# Patient Record
Sex: Female | Born: 1970 | Race: Black or African American | Hispanic: No | Marital: Married
Health system: Midwestern US, Community
[De-identification: ages and names within clinical notes are randomized; demographics above are authoritative.]

---

## 2012-07-15 IMAGING — MG MM DIGITAL SCREENING
7 series · 7 of 7 positions shown · non-contrast
Comparison: none

DG SCREEN MAMMOGRAM BILATERAL
Bilateral CC and MLO view(s) were taken.

DIGITAL SCREENING MAMMOGRAM WITH CAD:
There are scattered fibroglandular densities.  A possible mass is noted in the left breast.  Spot 
compression views and possibly sonography are recommended for further evaluation.  In the right 
breast, no masses or malignant type calcifications are identified.
Images were processed with CAD.

[L CC (1 of 2)]
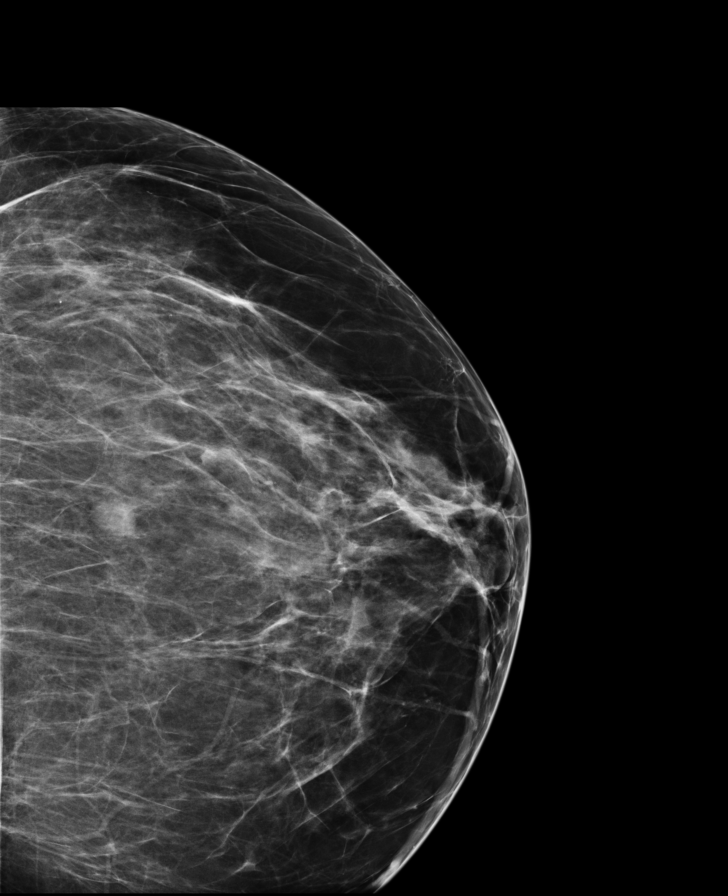

[L MLO]
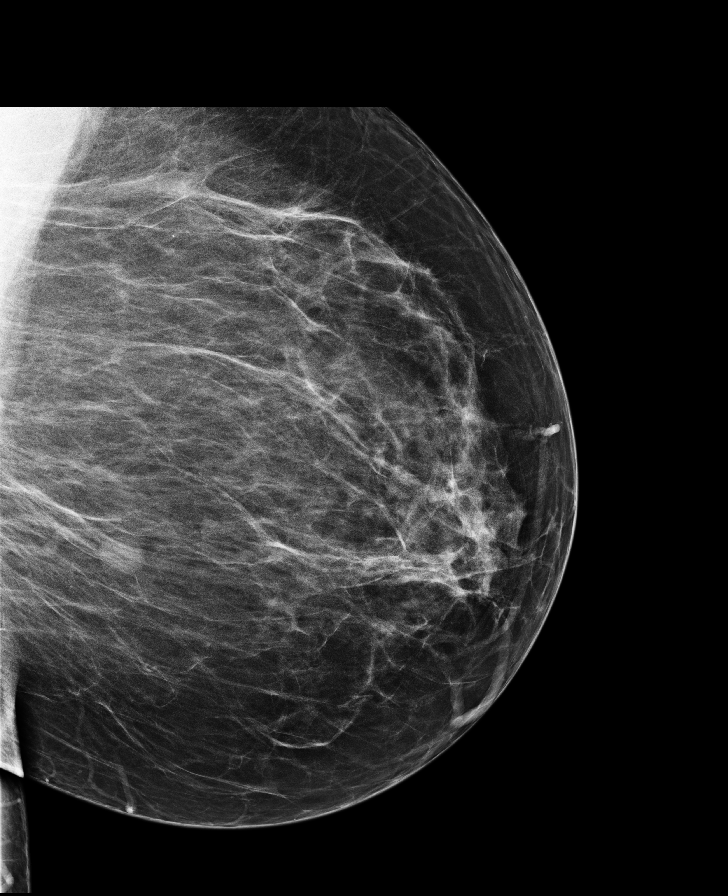

[R CC (1 of 2)]
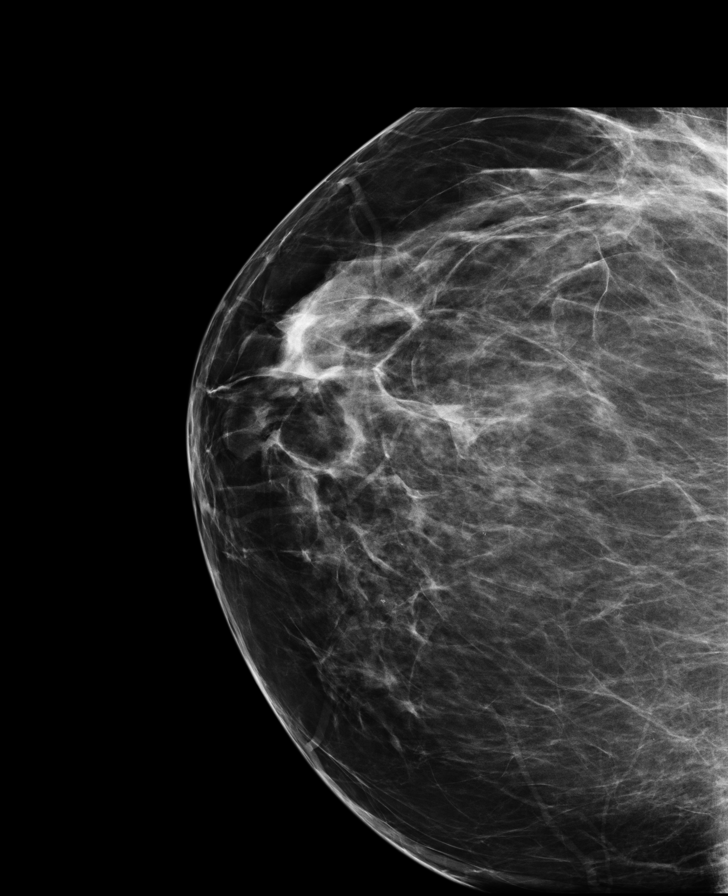

[R MLO (1 of 2)]
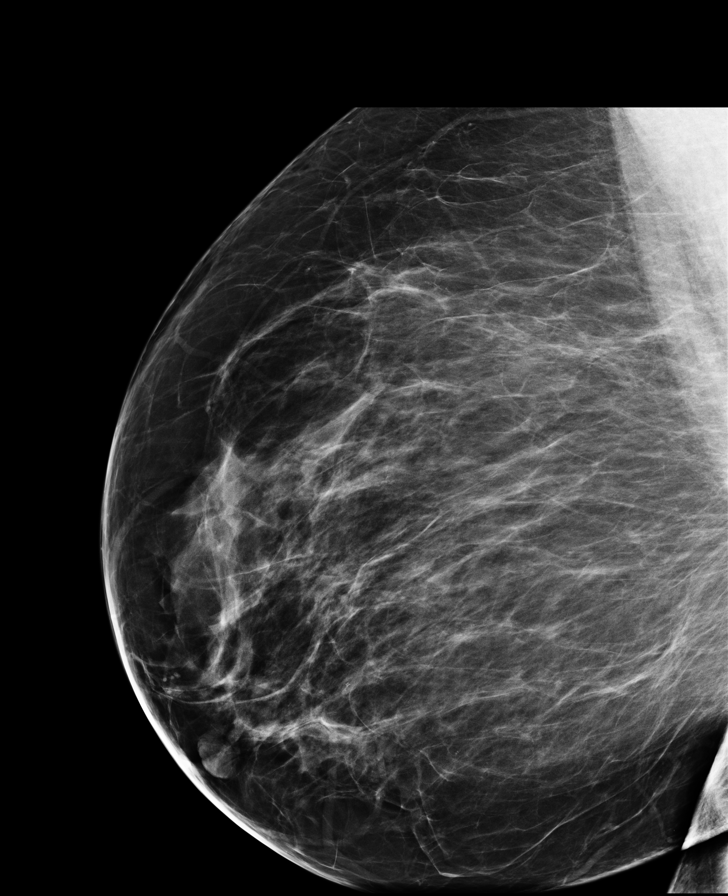

[L CC (2 of 2)]
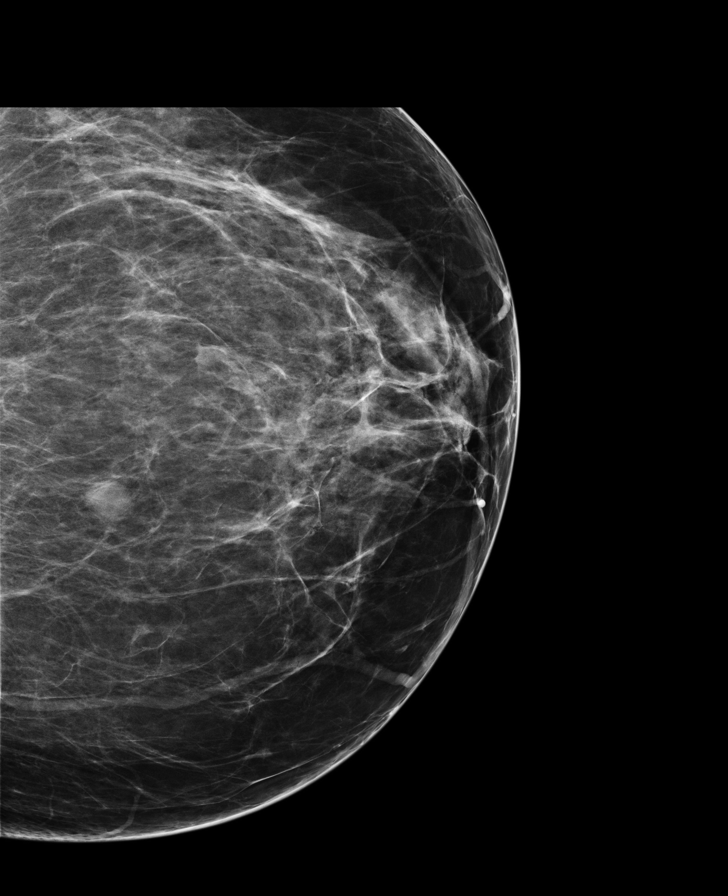

[R CC (2 of 2)]
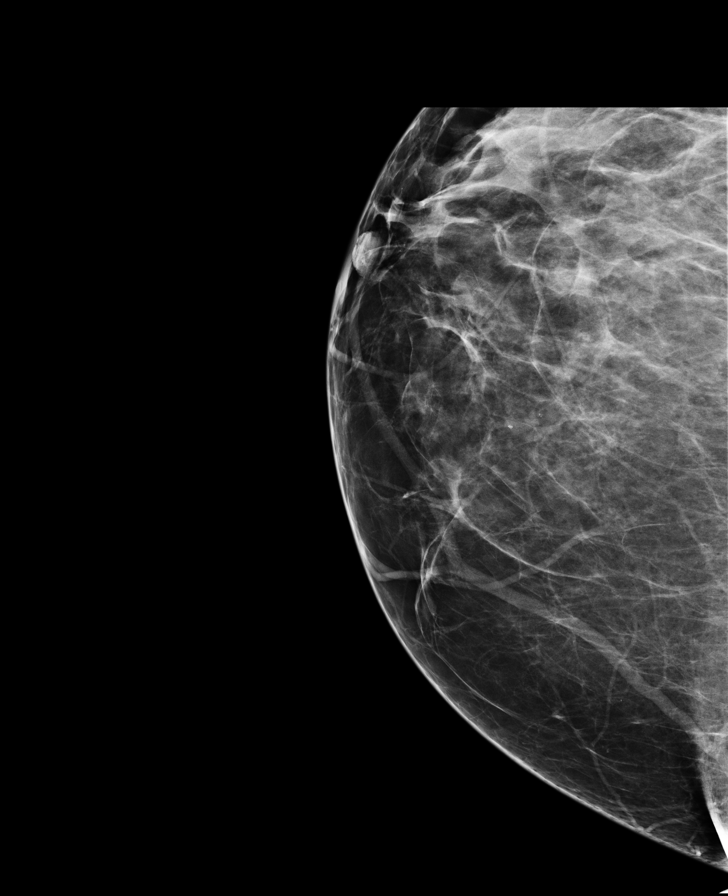

[R MLO (2 of 2)]
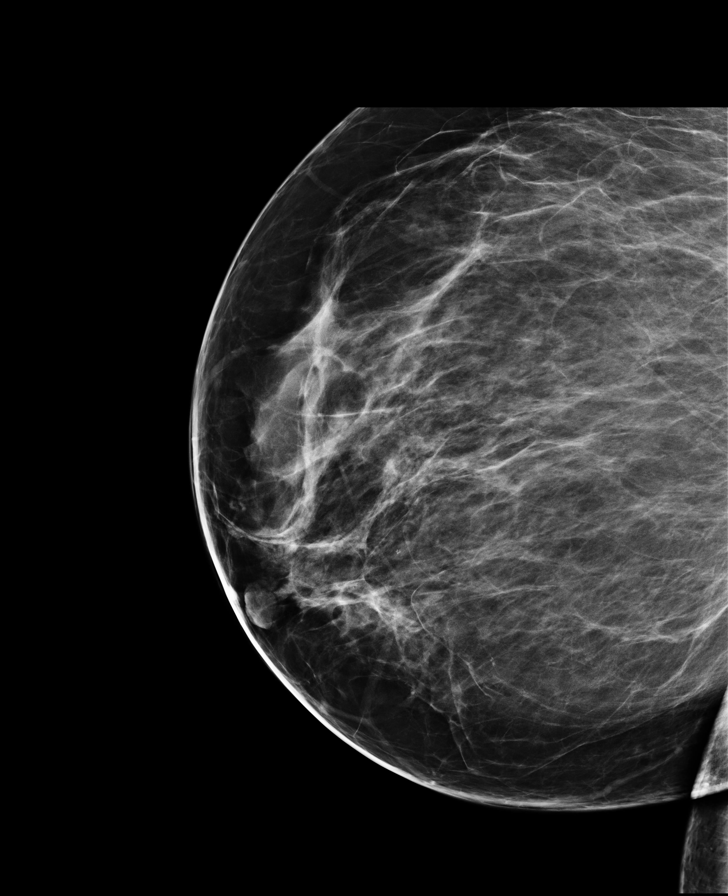

[7 of 7 positions shown; findings below may reference images not displayed]

IMPRESSION: Possible mass, left breast.  Additional evaluation is indicated.  The patient will be contacted for
additional studies and a supplementary report will follow.  No specific mammographic evidence of 
malignancy, right breast.

ASSESSMENT: Need additional imaging evaluation and/or prior mammograms for comparison - BI-RADS 0

Further imaging of the left breast.
,

## 2016-09-06 ENCOUNTER — Emergency Department: Admit: 2016-09-06 | Payer: BLUE CROSS/BLUE SHIELD

## 2016-09-06 ENCOUNTER — Inpatient Hospital Stay
Admit: 2016-09-06 | Discharge: 2016-09-06 | Disposition: A | Discharge status: Home / Self Care | Payer: BLUE CROSS/BLUE SHIELD | Attending: Emergency Medicine

## 2016-09-06 DIAGNOSIS — K5732 Diverticulitis of large intestine without perforation or abscess without bleeding: Secondary | ICD-10-CM

## 2016-09-06 LAB — CBC WITH AUTO DIFFERENTIAL
Absolute Eos #: 0.1 10*3/uL (ref 0.0–0.4)
Absolute Lymph #: 1.2 10*3/uL (ref 1.0–4.8)
Absolute Mono #: 0.4 10*3/uL (ref 0.2–0.8)
Basophils Absolute: 0 10*3/uL (ref 0.0–0.2)
Basophils: 0 %
Eosinophils %: 1 %
Hematocrit: 38.1 % (ref 36–46)
Hemoglobin: 12.6 g/dL (ref 12.0–16.0)
Lymphocytes: 11 %
MCH: 27.1 pg (ref 26–34)
MCHC: 33 g/dL (ref 31–37)
MCV: 82.2 fL (ref 80–100)
MPV: 8 fL (ref 6.0–12.0)
Monocytes: 3 %
Platelets: 309 10*3/uL (ref 130–400)
RBC: 4.64 m/uL (ref 4.0–5.2)
RDW: 16.4 % — ABNORMAL HIGH (ref 11.5–14.5)
Seg Neutrophils: 85 %
Segs Absolute: 9.7 10*3/uL — ABNORMAL HIGH (ref 1.8–7.7)
WBC: 11.4 10*3/uL — ABNORMAL HIGH (ref 3.5–11.0)

## 2016-09-06 LAB — MICROSCOPIC URINALYSIS
Epithelial Cells UA: 2 /HPF (ref 0–5)
RBC, UA: 5 /HPF (ref 0–2)

## 2016-09-06 LAB — BASIC METABOLIC PANEL
Anion Gap: 14 mmol/L (ref 9–17)
BUN: 17 mg/dL (ref 6–20)
Bun/Cre Ratio: 13 (ref 9–20)
CO2: 21 mmol/L (ref 20–31)
Calcium: 9.3 mg/dL (ref 8.6–10.4)
Chloride: 102 mmol/L (ref 98–107)
Creatinine: 1.32 mg/dL — ABNORMAL HIGH (ref 0.50–0.90)
GFR African American: 53 mL/min — ABNORMAL LOW (ref >60)
GFR Non-African American: 44 mL/min — ABNORMAL LOW (ref >60)
Glucose: 135 mg/dL — ABNORMAL HIGH (ref 70–99)
Potassium: 4.3 mmol/L (ref 3.7–5.3)
Sodium: 137 mmol/L (ref 135–144)

## 2016-09-06 LAB — UA W/REFLEX CULTURE
Bilirubin Urine: NEGATIVE
Glucose, Ur: NEGATIVE
Ketones, Urine: NEGATIVE
Leukocyte Esterase, Urine: NEGATIVE
Nitrite, Urine: NEGATIVE
Specific Gravity, UA: 1.028 (ref 1.005–1.030)
Urobilinogen, Urine: NORMAL
pH, UA: 5.5 (ref 5.0–8.0)

## 2016-09-06 LAB — POCT URINE PREGNANCY: Preg Test, Ur: NEGATIVE

## 2016-09-06 MED ORDER — HYDROCODONE-ACETAMINOPHEN 5-325 MG PO TABS
5-325 MG | ORAL_TABLET | ORAL | 0 refills | Status: AC | PRN
Start: 2016-09-06 — End: ?

## 2016-09-06 MED ORDER — POLYETHYLENE GLYCOL 3350 17 GM/SCOOP PO POWD
17 GM/SCOOP | Freq: Every day | ORAL | 0 refills | Status: AC
Start: 2016-09-06 — End: 2016-09-13

## 2016-09-06 MED ORDER — SODIUM CHLORIDE 0.9 % IV BOLUS
0.9 % | Freq: Once | INTRAVENOUS | Status: AC
Start: 2016-09-06 — End: 2016-09-06
  Administered 2016-09-06: 13:00:00 1000 mL via INTRAVENOUS

## 2016-09-06 MED ORDER — HYDROMORPHONE HCL 2 MG/ML IJ SOLN
2 MG/ML | INTRAMUSCULAR | Status: DC | PRN
Start: 2016-09-06 — End: 2016-09-06
  Administered 2016-09-06: 13:00:00 1 mg via INTRAVENOUS

## 2016-09-06 MED ORDER — METRONIDAZOLE 500 MG PO TABS
500 MG | ORAL_TABLET | Freq: Three times a day (TID) | ORAL | 0 refills | Status: AC
Start: 2016-09-06 — End: ?

## 2016-09-06 MED ORDER — AMOXICILLIN-POT CLAVULANATE 875-125 MG PO TABS
875-125 MG | ORAL_TABLET | Freq: Three times a day (TID) | ORAL | 0 refills | Status: AC
Start: 2016-09-06 — End: ?

## 2016-09-06 MED ORDER — PROMETHAZINE HCL 25 MG/ML IJ SOLN
25 MG/ML | Freq: Once | INTRAMUSCULAR | Status: AC
Start: 2016-09-06 — End: 2016-09-06
  Administered 2016-09-06: 13:00:00 12.5 mg via INTRAVENOUS

## 2016-09-06 MED FILL — PROMETHAZINE HCL 25 MG/ML IJ SOLN: 25 mg/mL | INTRAMUSCULAR | Qty: 1

## 2016-09-06 MED FILL — HYDROMORPHONE HCL 2 MG/ML IJ SOLN: 2 MG/ML | INTRAMUSCULAR | Qty: 1

## 2016-09-06 NOTE — Discharge Instructions (Signed)
Do not drink alcohol while on Flagyl.  Eat a half cup of yogurt daily.  Liquid diet for 2 days.

## 2016-09-06 NOTE — ED Provider Notes (Signed)
[place] [facility] ED  eMERGENCY dEPARTMENT eNCOUnter      Pt Name: Catherine Carr  MRN: [id number]  Birthdate September 12, 1971  Date of evaluation: 09/06/2016  Provider: Wess Botts, MD    CHIEF COMPLAINT       Chief Complaint   Patient presents with   ??? Abdominal Pain         HISTORY OF PRESENT ILLNESS   (Location/Symptom, Timing/Onset, Context/Setting, Quality, Duration, Modifying Factors, Severity)  Note limiting factors.   Catherine Carr is a 45 y.o. female who presents to the emergency department With a chief complaint of lower abdominal pain starting in the last 24 hours.  Patient recently drove into town from [place] yesterday to visit her father who is ill and is admitted to the hospital.  Patient states she has a history of diverticulitis.  She denies fever or vomiting.  She does not report bloody stool.  She has a history of anemia which she relegates to heavy menstrual periods.     The history is provided by the patient.       Nursing Notes were reviewed.    REVIEW OF SYSTEMS    (2-9 systems for level 4, 10 or more for level 5)     Review of Systems   Genitourinary: Positive for menstrual problem ( heavy menses.).       Except as noted above the remainder of the review of systems was reviewed and negative.       PAST MEDICAL HISTORY     Past Medical History:   Diagnosis Date   ??? Diabetes mellitus (HCC)    ??? Diverticulitis    ??? Hypertension          SURGICAL HISTORY       Past Surgical History:   Procedure Laterality Date   ??? CESAREAN SECTION           CURRENT MEDICATIONS       Discharge Medication List as of 09/06/2016  9:35 AM      CONTINUE these medications which have NOT CHANGED    Details   triamterene-hydrochlorothiazide (MAXZIDE-25) 37.5-25 MG per tablet Take 1 tablet by mouth dailyHistorical Med      methyldopa (ALDOMET) 500 MG tablet Take 500 mg by mouth 3 times dailyHistorical Med      vitamin D (ERGOCALCIFEROL) 50000 units CAPS capsule Take 50,000 Units by mouth once a weekHistorical Med       losartan (COZAAR) 100 MG tablet Take 100 mg by mouth dailyHistorical Med      fluticasone (FLONASE) 50 MCG/ACT nasal spray 1 spray by Nasal route dailyHistorical Med      megestrol (MEGACE) 40 MG tablet Take 40 mg by mouth dailyHistorical Med      acetaminophen (TYLENOL) 325 MG tablet Take 650 mg by mouth every 6 hours as needed for PainHistorical Med             ALLERGIES     Ciprofloxacin    FAMILY HISTORY     History reviewed. No pertinent family history.       SOCIAL HISTORY       Social History     Social History   ??? Marital status: Married     Spouse name: N/A   ??? Number of children: N/A   ??? Years of education: N/A     Social History Main Topics   ??? Smoking status: Never Smoker   ??? Smokeless tobacco: Never Used   ??? Alcohol use No   ???  Drug use: No   ??? Sexual activity: Not Asked     Other Topics Concern   ??? None     Social History Narrative   ??? None       SCREENINGS             PHYSICAL EXAM    (up to 7 for level 4, 8 or more for level 5)     ED Triage Vitals [09/06/16 0710]   BP Temp Temp Source Pulse Resp SpO2 Height Weight   (!) 151/91 98.3 ??F (36.8 ??C) Oral 91 20 100 % 5\' 3"  (1.6 m) (!) 345 lb (156.5 kg)       Physical Exam   Constitutional: She is oriented to person, place, and time. She appears well-developed and well-nourished. She appears distressed.   HENT:   Head: Normocephalic.   Right Ear: External ear normal.   Left Ear: External ear normal.   Nose: Nose normal.   Mouth/Throat: Oropharynx is clear and moist.   Eyes: EOM are normal.   Neck: Neck supple.   Cardiovascular: Normal rate, regular rhythm and normal heart sounds.    Pulmonary/Chest: Effort normal and breath sounds normal. No respiratory distress. She has no rales.   Abdominal: Soft. She exhibits no distension and no mass. There is no hepatosplenomegaly. There is tenderness in the right lower quadrant, suprapubic area and left lower quadrant. There is no rebound and no guarding. Hernia: umbilical, nontender.   Musculoskeletal: Normal range  of motion.   Neurological: She is alert and oriented to person, place, and time. No cranial nerve deficit. She exhibits normal muscle tone.   Skin: Skin is warm and dry. She is not diaphoretic. No pallor.   Nursing note and vitals reviewed.      DIAGNOSTIC RESULTS     EKG: All EKG's are interpreted by the Emergency Department Physician who either signs or Co-signs this chart in the absence of a cardiologist.    RADIOLOGY:   Non-plain film images such as CT, Ultrasound and MRI are read by the radiologist. Plain radiographic images are visualized and preliminarily interpreted by the emergency physician with the below findings:    Interpretation per the Radiologist below, if available at the time of this note:    CT ABDOMEN PELVIS WO IV CONTRAST   Preliminary Result   1. Uncomplicated sigmoid diverticulitis.  Recommend follow-up at resolution   of patient's acute issues as an underlying mass cannot be excluded.  There is   no abscess or evidence of free intraperitoneal air.   2. Fat containing umbilical hernia.  No associated inflammation or evidence   of bowel obstruction.   3. Superficial 2.0 x 1.2 cm soft tissue nodule along the medial aspect of the   left inframammary fold.  If patient is not up-to-date on screening   mammography, recommend mammogram for further evaluation.   4. Normal appendix.               ED BEDSIDE ULTRASOUND:   Performed by ED Physician - none    LABS:  Labs Reviewed   UA W/REFLEX CULTURE - Abnormal; Notable for the following:        Result Value    Urine Hgb 1+ (*)     Protein, UA TRACE (*)     All other components within normal limits   BASIC METABOLIC PANEL - Abnormal; Notable for the following:     Glucose 135 (*)     CREATININE 1.32 (*)  GFR Non-African American 44 (*)     GFR African American 53 (*)     All other components within normal limits   CBC WITH AUTO DIFFERENTIAL - Abnormal; Notable for the following:     WBC 11.4 (*)     RDW 16.4 (*)     Segs Absolute 9.70 (*)     All  other components within normal limits   MICROSCOPIC URINALYSIS - Abnormal; Notable for the following:     Mucus, UA 1+ (*)     All other components within normal limits   POCT URINE PREGNANCY - Normal   POCT URINALYSIS DIPSTICK       All other labs were within normal range or not returned as of this dictation.    EMERGENCY DEPARTMENT COURSE and DIFFERENTIAL DIAGNOSIS/MDM:   Vitals:    Vitals:    09/06/16 0710   BP: (!) 151/91   Pulse: 91   Resp: 20   Temp: 98.3 ??F (36.8 ??C)   TempSrc: Oral   SpO2: 100%   Weight: (!) 345 lb (156.5 kg)   Height: 5\' 3"  (1.6 m)       Imaging is consistent with uncomplicated sigmoid diverticulitis.  Patient is started on oral antibiotics, Norco and MiraLAX and discharged.  Pain was controlled in the ED with IV Dilaudid in 2 doses.    MDM    CONSULTS:  None    PROCEDURES:  Unless otherwise noted below, none     Procedures    FINAL IMPRESSION      1. Diverticulitis of colon          DISPOSITION/PLAN   DISPOSITION Decision to Discharge    PATIENT REFERRED TO:  Primary Care Physician    In 1 week      [facility] ED  [address] [place] [address]  [phone]    As needed, If symptoms worsen      DISCHARGE MEDICATIONS:  Discharge Medication List as of 09/06/2016  9:35 AM      START taking these medications    Details   HYDROcodone-acetaminophen (NORCO) 5-325 MG per tablet Take 1 tablet by mouth every 4 hours as needed for Pain ., Disp-20 tablet, R-0Print      polyethylene glycol (MIRALAX) powder Take 17 g by mouth daily for 7 days PRN constipation, Disp-1 Bottle, R-0Print      metroNIDAZOLE (FLAGYL) 500 MG tablet Take 1 tablet by mouth every 8 hours, Disp-30 tablet, R-0Print      amoxicillin-clavulanate (AUGMENTIN) 875-125 MG per tablet Take 1 tablet by mouth every 8 hours Take with food., Disp-30 tablet, R-0Print                    Summation      Patient Course:  Improved.    ED Medications administered this visit:    Medications   HYDROmorphone (DILAUDID) injection 1 mg (1  mg Intravenous Given 09/06/16 0754)   0.9 % sodium chloride bolus (0 mLs Intravenous Stopped 09/06/16 1009)   promethazine (PHENERGAN) injection 12.5 mg (12.5 mg Intravenous Given 09/06/16 0754)       New Prescriptions from this visit:    Discharge Medication List as of 09/06/2016  9:35 AM      START taking these medications    Details   HYDROcodone-acetaminophen (NORCO) 5-325 MG per tablet Take 1 tablet by mouth every 4 hours as needed for Pain ., Disp-20 tablet, R-0Print      polyethylene  glycol (MIRALAX) powder Take 17 g by mouth daily for 7 days PRN constipation, Disp-1 Bottle, R-0Print      metroNIDAZOLE (FLAGYL) 500 MG tablet Take 1 tablet by mouth every 8 hours, Disp-30 tablet, R-0Print      amoxicillin-clavulanate (AUGMENTIN) 875-125 MG per tablet Take 1 tablet by mouth every 8 hours Take with food., Disp-30 tablet, R-0Print             Follow-up:  Primary Care Physician    In 1 week      [facility]  [address]  [place] [id number]  [phone]    As needed, If symptoms worsen        Final Impression:   1. Diverticulitis of colon               (Please note that portions of this note were completed with a voice recognition program.  Efforts were made to edit the dictations but occasionally words are mis-transcribed.)    Wess BottsSyed Z Keval Nam, MD (electronically signed)  Attending Emergency Physician           Wess BottsSyed Z Katoya Amato, MD  09/06/16 1225

## 2016-09-06 NOTE — ED Notes (Signed)
Patient presents to the er with c/o low abdominal pain accompanied by a lot of pressure with urination. Patient verbalizing onset was [place] Patient denies N/V upon inspection belly soft.     Mayford KnifeBillie J Giuliana Handyside, RN  09/06/16 [phone]

## 2016-09-07 LAB — POCT URINE PREGNANCY: HCG, Pregnancy Urine (POC): NEGATIVE

## 2022-07-05 IMAGING — DX DG CHEST 1V PORT
1 series · 1 of 1 positions shown · non-contrast
Comparison: None.

CLINICAL DATA: Mid chest pain

EXAM:
PORTABLE CHEST 1 VIEW

[chest ap]
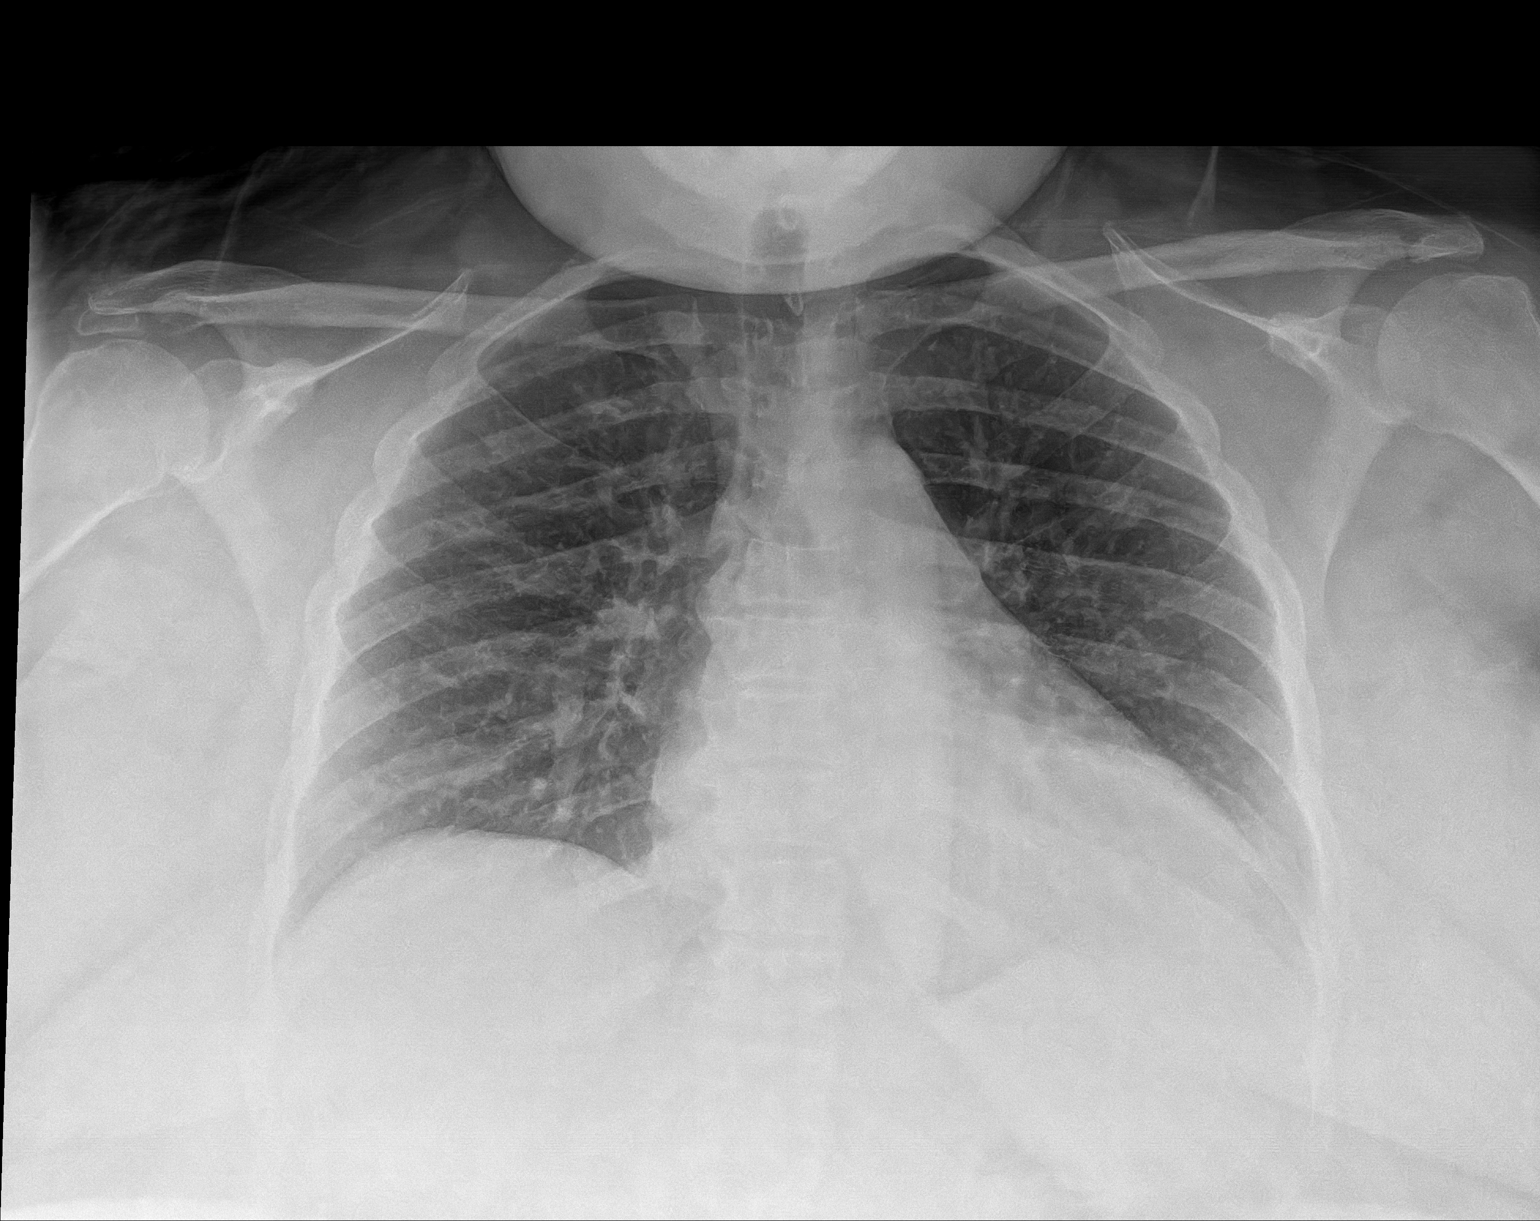

[1 of 1 positions shown; findings below may reference images not displayed]

FINDINGS: No consolidation or edema. No pleural effusion or pneumothorax.
Cardiomediastinal contours are likely within normal limits for
portable technique.
IMPRESSION: No acute process in the chest.
